# Patient Record
Sex: Female | Born: 1995 | Race: Black or African American | Hispanic: No | Marital: Single | Smoking: Never smoker
Health system: Southern US, Community
[De-identification: ages and names within clinical notes are randomized; demographics above are authoritative.]

## PROBLEM LIST (undated history)

## (undated) DIAGNOSIS — R569 Unspecified convulsions: Secondary | ICD-10-CM

## (undated) HISTORY — PX: TONSILLECTOMY: SUR1361

---

## 2017-02-14 DIAGNOSIS — N939 Abnormal uterine and vaginal bleeding, unspecified: Secondary | ICD-10-CM | POA: Insufficient documentation

## 2017-04-02 ENCOUNTER — Encounter: Attending: Family Medicine | Primary: Family Medicine

## 2017-04-04 ENCOUNTER — Encounter: Primary: Family Medicine

## 2017-04-09 ENCOUNTER — Encounter: Primary: Family Medicine

## 2017-04-11 ENCOUNTER — Encounter: Primary: Family Medicine

## 2017-07-25 ENCOUNTER — Inpatient Hospital Stay
Admit: 2017-07-25 | Discharge: 2017-07-25 | Disposition: A | Discharge status: Home / Self Care | Payer: BLUE CROSS/BLUE SHIELD | Attending: Emergency Medicine

## 2017-07-25 DIAGNOSIS — M2560 Stiffness of unspecified joint, not elsewhere classified: Secondary | ICD-10-CM

## 2017-07-25 MED ORDER — NAPROXEN 500 MG TAB
500 mg | ORAL_TABLET | Freq: Two times a day (BID) | ORAL | 0 refills | Status: AC
Start: 2017-07-25 — End: 2017-08-04

## 2017-07-25 NOTE — ED Notes (Signed)
I have reviewed discharge instructions with the patient.  The patient verbalized understanding.    Patient left ED via Discharge Method: ambulatory to Home with self.   Opportunity for questions and clarification provided.       Patient given 1 scripts.         To continue your aftercare when you leave the hospital, you may receive an automated call from our care team to check in on how you are doing.  This is a free service and part of our promise to provide the best care and service to meet your aftercare needs.??? If you have questions, or wish to unsubscribe from this service please call [phone].  Thank you for Choosing our [facility].
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
## 2018-04-14 DIAGNOSIS — R569 Unspecified convulsions: Secondary | ICD-10-CM | POA: Insufficient documentation

## 2018-05-14 DIAGNOSIS — G43009 Migraine without aura, not intractable, without status migrainosus: Secondary | ICD-10-CM | POA: Insufficient documentation

## 2018-09-15 ENCOUNTER — Emergency Department: Admit: 2018-09-15 | Payer: BLUE CROSS/BLUE SHIELD | Primary: Family Medicine

## 2018-09-15 ENCOUNTER — Inpatient Hospital Stay
Admit: 2018-09-15 | Discharge: 2018-09-16 | Disposition: A | Discharge status: Home / Self Care | Payer: BLUE CROSS/BLUE SHIELD | Attending: Emergency Medicine

## 2018-09-15 DIAGNOSIS — R569 Unspecified convulsions: Secondary | ICD-10-CM

## 2018-09-15 LAB — CBC W/O DIFF
ABSOLUTE NRBC: 0 10*3/uL (ref 0.0–0.2)
HCT: 45.3 % (ref 35.8–46.3)
HGB: 15.8 g/dL — ABNORMAL HIGH (ref 11.7–15.4)
MCH: 31.1 PG (ref 26.1–32.9)
MCHC: 34.9 g/dL (ref 31.4–35.0)
MCV: 89.2 FL (ref 79.6–97.8)
MPV: 10.1 FL (ref 9.4–12.3)
PLATELET: 268 10*3/uL (ref 150–450)
RBC: 5.08 M/uL (ref 4.05–5.2)
RDW: 12.5 % (ref 11.9–14.6)
WBC: 8.6 10*3/uL (ref 4.3–11.1)

## 2018-09-15 LAB — METABOLIC PANEL, COMPREHENSIVE
A-G Ratio: 1.1 — ABNORMAL LOW (ref 1.2–3.5)
ALT (SGPT): 36 U/L (ref 12–65)
AST (SGOT): 22 U/L (ref 15–37)
Albumin: 4.2 g/dL (ref 3.5–5.0)
Alk. phosphatase: 72 U/L (ref 50–130)
Anion gap: 6 mmol/L — ABNORMAL LOW (ref 7–16)
BUN: 9 MG/DL (ref 6–23)
Bilirubin, total: 0.4 MG/DL (ref 0.2–1.1)
CO2: 26 mmol/L (ref 21–32)
Calcium: 9.4 MG/DL (ref 8.3–10.4)
Chloride: 108 mmol/L — ABNORMAL HIGH (ref 98–107)
Creatinine: 0.92 MG/DL (ref 0.6–1.0)
GFR est AA: >60 mL/min/{1.73_m2} (ref >60)
GFR est non-AA: >60 mL/min/{1.73_m2} (ref >60)
Globulin: 4 g/dL — ABNORMAL HIGH (ref 2.3–3.5)
Glucose: 96 mg/dL (ref 65–100)
Potassium: 3.7 mmol/L (ref 3.5–5.1)
Protein, total: 8.2 g/dL (ref 6.3–8.2)
Sodium: 140 mmol/L (ref 136–145)

## 2018-09-15 LAB — HCG URINE, QL. - POC
HCG, Pregnancy, Urine, POC: NEGATIVE
Pregnancy test,urine (POC): NEGATIVE

## 2018-09-15 LAB — COMPREHENSIVE METABOLIC PANEL
ALT: 36 U/L (ref 12–65)
AST: 22 U/L (ref 15–37)
Albumin/Globulin Ratio: 1.1 — ABNORMAL LOW (ref 1.2–3.5)
Albumin: 4.2 g/dL (ref 3.5–5.0)
Alkaline Phosphatase: 72 U/L (ref 50–130)
Anion Gap: 6 mmol/L — ABNORMAL LOW (ref 7–16)
BUN: 9 MG/DL (ref 6–23)
CO2: 26 mmol/L (ref 21–32)
Calcium: 9.4 MG/DL (ref 8.3–10.4)
Chloride: 108 mmol/L — ABNORMAL HIGH (ref 98–107)
Creatinine: 0.92 MG/DL (ref 0.6–1.0)
GFR African American: >60 mL/min/{1.73_m2} (ref >60)
Globulin: 4 g/dL — ABNORMAL HIGH (ref 2.3–3.5)
Glucose: 96 mg/dL (ref 65–100)
Potassium: 3.7 mmol/L (ref 3.5–5.1)
Sodium: 140 mmol/L (ref 136–145)
Total Bilirubin: 0.4 MG/DL (ref 0.2–1.1)
Total Protein: 8.2 g/dL (ref 6.3–8.2)
eGFR NON-AA: >60 mL/min/{1.73_m2} (ref >60)

## 2018-09-15 LAB — CBC
Hematocrit: 45.3 % (ref 35.8–46.3)
Hemoglobin: 15.8 g/dL — ABNORMAL HIGH (ref 11.7–15.4)
MCH: 31.1 PG (ref 26.1–32.9)
MCHC: 34.9 g/dL (ref 31.4–35.0)
MCV: 89.2 FL (ref 79.6–97.8)
MPV: 10.1 FL (ref 9.4–12.3)
NRBC Absolute: 0 10*3/uL (ref 0.0–0.2)
Platelets: 268 10*3/uL (ref 150–450)
RBC: 5.08 M/uL (ref 4.05–5.2)
RDW: 12.5 % (ref 11.9–14.6)
WBC: 8.6 10*3/uL (ref 4.3–11.1)

## 2018-09-15 NOTE — ED Notes (Signed)
I have reviewed discharge instructions with the patient.  The patient verbalized understanding. Patient to follow up with her neuro tomorrow as referred and RTED with any further SZ activity, severe HAs, any other acute complaints. Patient expresses understanding. Patient from ED in NAD, no new Rx.

## 2018-09-15 NOTE — ED Provider Notes (Signed)
23 year old female presents with concerns about having had a seizure as she was walking into her parents house today.  She said it is her third seizure over the last 6 months.  She said her first seizure was approximately 6 months ago.  She does not recall exactly what happened.  This happened at around 4:00 today.  EMS came but she did not want to go where EMS wanted to take her so she came here instead.  She said that she recently switched antiseizure medicines approximately 2 weeks ago with her neurologist.    She said that she did hit her head and now has a mild headache.    Denies any other symptoms including no chest pain, difficulty breathing, nausea, vomiting, diarrhea, or abdominal pain.               No past medical history on file.    Past Surgical History:   Procedure Laterality Date   ??? HX TONSILLECTOMY           No family history on file.    Social History     Socioeconomic History   ??? Marital status: LIFE PARTNER     Spouse name: Not on file   ??? Number of children: Not on file   ??? Years of education: Not on file   ??? Highest education level: Not on file   Occupational History   ??? Not on file   Social Needs   ??? Financial resource strain: Not on file   ??? Food insecurity:     Worry: Not on file     Inability: Not on file   ??? Transportation needs:     Medical: Not on file     Non-medical: Not on file   Tobacco Use   ??? Smoking status: Never Smoker   ??? Smokeless tobacco: Never Used   Substance and Sexual Activity   ??? Alcohol use: No     Frequency: Never   ??? Drug use: No   ??? Sexual activity: Not on file   Lifestyle   ??? Physical activity:     Days per week: Not on file     Minutes per session: Not on file   ??? Stress: Not on file   Relationships   ??? Social connections:     Talks on phone: Not on file     Gets together: Not on file     Attends religious service: Not on file     Active member of club or organization: Not on file     Attends meetings of clubs or organizations: Not on file     Relationship status:  Not on file   ??? Intimate partner violence:     Fear of current or ex partner: Not on file     Emotionally abused: Not on file     Physically abused: Not on file     Forced sexual activity: Not on file   Other Topics Concern   ??? Not on file   Social History Narrative   ??? Not on file         ALLERGIES: Patient has no known allergies.    Review of Systems   Constitutional: Negative for chills, diaphoresis and fever.   HENT: Negative for congestion, rhinorrhea and sore throat.    Eyes: Negative for redness and visual disturbance.   Respiratory: Negative for cough, chest tightness, shortness of breath and wheezing.    Cardiovascular: Negative for chest pain and palpitations.  Gastrointestinal: Negative for abdominal pain, blood in stool, diarrhea, nausea and vomiting.   Endocrine: Negative for polydipsia and polyuria.   Genitourinary: Negative for dysuria and hematuria.   Musculoskeletal: Negative for arthralgias, myalgias and neck stiffness.   Skin: Negative for rash.   Allergic/Immunologic: Negative for environmental allergies and food allergies.   Neurological: Positive for seizures and headaches. Negative for dizziness and weakness.   Hematological: Negative for adenopathy. Does not bruise/bleed easily.   Psychiatric/Behavioral: Negative for confusion and sleep disturbance. The patient is not nervous/anxious.        Vitals:    09/15/18 1715   BP: 138/87   Pulse: 77   Resp: 18   Temp: 98.9 ??F (37.2 ??C)   SpO2: 98%   Weight: 87.5 kg (193 lb)   Height: 5\' 5"  (1.651 m)            Physical Exam  Vitals signs and nursing note reviewed.   Constitutional:       General: She is not in acute distress.     Appearance: She is well-developed. She is not toxic-appearing.   HENT:      Head: Normocephalic and atraumatic.   Eyes:      General: No scleral icterus.        Right eye: No discharge.         Left eye: No discharge.      Conjunctiva/sclera: Conjunctivae normal.      Pupils: Pupils are equal, round, and reactive to light.    Neck:      Musculoskeletal: Normal range of motion. No neck rigidity.   Cardiovascular:      Rate and Rhythm: Normal rate and regular rhythm.      Heart sounds: Normal heart sounds.   Pulmonary:      Effort: Pulmonary effort is normal. No respiratory distress.      Breath sounds: Normal breath sounds. No wheezing or rales.   Chest:      Chest wall: No tenderness.   Abdominal:      General: Bowel sounds are normal. There is no distension.      Palpations: Abdomen is soft.      Tenderness: There is no guarding or rebound.   Musculoskeletal: Normal range of motion.         General: No tenderness.   Lymphadenopathy:      Cervical: No cervical adenopathy.   Skin:     General: Skin is warm and dry.   Neurological:      General: No focal deficit present.      Mental Status: She is alert and oriented to person, place, and time.   Psychiatric:         Mood and Affect: Mood normal.         Behavior: Behavior normal.          MDM  Number of Diagnoses or Management Options  Diagnosis management comments: Given the fall and hitting her head I will get a CT of her head to rule out occult intracranial injury.         Procedures

## 2018-09-15 NOTE — ED Notes (Signed)
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 Pt had a seizure aound 1600 Witnessed by family. EMS came, but pt did not want to go to the hospital with. Them. Pt takes tribecca, takes medication as prescribed. Pt fell hit posterior right side, no bleeding noted but painful to tough. Pt bit her tongue and lip on right side. Pt remembers her brother talking to her but was unable to respond. Pt a/o x 4 at this time

## 2018-09-15 NOTE — ED Triage Notes (Signed)
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 Pt had a seizure aound 1600 Witnessed by family. EMS came, but pt did not want to go to the hospital with. Them. Pt takes tribecca, takes medication as prescribed. Pt fell hit posterior right side, no bleeding noted but painful to tough. Pt bit her tongue and lip on right side. Pt remembers her brother talking to her but was unable to respond. Pt a/o x 4 at this time
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
## 2018-09-15 NOTE — ED Notes (Signed)
I have reviewed discharge instructions with the patient.  The patient verbalized understanding. Patient to follow up with her neuro tomorrow as referred and RTED with any further SZ activity, severe HAs, any other acute complaints. Patient expresses understanding. Patient from ED in NAD, no new Rx.

## 2019-01-28 DIAGNOSIS — N920 Excessive and frequent menstruation with regular cycle: Secondary | ICD-10-CM | POA: Insufficient documentation

## 2019-09-14 ENCOUNTER — Encounter (HOSPITAL_COMMUNITY): Payer: Self-pay | Admitting: Emergency Medicine

## 2019-09-14 ENCOUNTER — Other Ambulatory Visit: Payer: Self-pay

## 2019-09-14 ENCOUNTER — Emergency Department (HOSPITAL_COMMUNITY)
Admission: EM | Admit: 2019-09-14 | Discharge: 2019-09-14 | Disposition: A | Discharge status: Home / Self Care | Payer: BC Managed Care – PPO | Attending: Emergency Medicine | Admitting: Emergency Medicine

## 2019-09-14 ENCOUNTER — Emergency Department (HOSPITAL_COMMUNITY): Payer: BC Managed Care – PPO

## 2019-09-14 DIAGNOSIS — Y92410 Unspecified street and highway as the place of occurrence of the external cause: Secondary | ICD-10-CM | POA: Insufficient documentation

## 2019-09-14 DIAGNOSIS — R569 Unspecified convulsions: Secondary | ICD-10-CM | POA: Diagnosis not present

## 2019-09-14 DIAGNOSIS — Y999 Unspecified external cause status: Secondary | ICD-10-CM | POA: Diagnosis not present

## 2019-09-14 DIAGNOSIS — Y93I9 Activity, other involving external motion: Secondary | ICD-10-CM | POA: Diagnosis not present

## 2019-09-14 DIAGNOSIS — M542 Cervicalgia: Secondary | ICD-10-CM | POA: Insufficient documentation

## 2019-09-14 HISTORY — DX: Unspecified convulsions: R56.9

## 2019-09-14 MED ORDER — ACETAMINOPHEN 500 MG PO TABS
1000.0000 mg | ORAL_TABLET | Freq: Once | ORAL | Status: AC
  Administered 2019-09-14: 1000 mg via ORAL
  Filled 2019-09-14: qty 2

## 2019-09-14 NOTE — ED Notes (Signed)
Pt requesting something for headache,  Dr. Adela Lank made aware

## 2019-09-14 NOTE — ED Notes (Signed)
[employer] in talking with pt at this time.  Pt remains alert and oriented x's 4

## 2019-09-14 NOTE — ED Notes (Signed)
Pt up to bedside commode with assistance without any problems
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
## 2019-09-15 ENCOUNTER — Encounter: Payer: Self-pay | Admitting: Neurology

## 2019-12-16 ENCOUNTER — Encounter: Payer: Self-pay | Admitting: Obstetrics

## 2019-12-16 ENCOUNTER — Other Ambulatory Visit: Payer: Self-pay

## 2019-12-16 ENCOUNTER — Ambulatory Visit (INDEPENDENT_AMBULATORY_CARE_PROVIDER_SITE_OTHER): Payer: BC Managed Care – PPO | Admitting: Obstetrics

## 2019-12-16 VITALS — BP 115/72 | Ht 65.0 in | Wt 199.8 lb

## 2019-12-16 DIAGNOSIS — Z113 Encounter for screening for infections with a predominantly sexual mode of transmission: Secondary | ICD-10-CM | POA: Diagnosis not present

## 2019-12-16 DIAGNOSIS — B373 Candidiasis of vulva and vagina: Secondary | ICD-10-CM

## 2019-12-16 DIAGNOSIS — Z30011 Encounter for initial prescription of contraceptive pills: Secondary | ICD-10-CM

## 2019-12-16 DIAGNOSIS — B3731 Acute candidiasis of vulva and vagina: Secondary | ICD-10-CM

## 2019-12-16 DIAGNOSIS — N926 Irregular menstruation, unspecified: Secondary | ICD-10-CM | POA: Diagnosis not present

## 2019-12-16 DIAGNOSIS — Z01419 Encounter for gynecological examination (general) (routine) without abnormal findings: Secondary | ICD-10-CM | POA: Insufficient documentation

## 2019-12-16 DIAGNOSIS — N939 Abnormal uterine and vaginal bleeding, unspecified: Secondary | ICD-10-CM

## 2019-12-16 LAB — POCT URINE PREGNANCY: Preg Test, Ur: NEGATIVE

## 2019-12-16 MED ORDER — TERCONAZOLE 0.4 % VA CREA: 1.0000 | TOPICAL_CREAM | Freq: Every day | VAGINAL | 1 refills | Status: DC

## 2019-12-16 MED ORDER — LO LOESTRIN FE 1 MG-10 MCG / 10 MCG PO TABS: 1.0000 | ORAL_TABLET | Freq: Every day | ORAL | 11 refills | Status: DC

## 2019-12-16 NOTE — Progress Notes (Signed)
.     Gynecology Annual Exam  PCP: Bethany Powers, No Pcp Per  Chief Complaint:  Chief Complaint  Bethany Powers presents with  . Gynecologic Exam    npa     History of Present Illness: Bethany Powers is a 24 y.o. No obstetric history on file. presents for annual exam. The Bethany Powers has no complaints today.   LMP: Bethany Powers's last menstrual period was 09/13/2019. Menarche:11 Average Interval: irregular, has been beeding irregularly for several months  Duration of flow: ongoing  Heavy Menses: no Clots: no Intermenstrual Bleeding: yes Postcoital Bleeding: not applicable Dysmenorrhea: no  The Bethany Powers is not currently sexually active. She currently uses none for contraception. She denies dyspareunia.  The Bethany Powers does not perform self breast exams.  There is no notable family history of breast or ovarian cancer in her family.  The Bethany Powers wears seatbelts: yes.  The Bethany Powers has regular exercise: no.    The Bethany Powers denies current symptoms of depression.    Review of Systems: ROS  Past Medical History:  Bethany Powers Active Problem List   Diagnosis Date Noted  . Encounter for annual routine gynecological examination 12/16/2019  . Abnormal uterine bleeding (AUB) 12/16/2019    12/16/19-new Annual pt- continuous light vaginal bleeding x 3 months reported   . Routine screening for STI (sexually transmitted infection) 12/16/2019    Past Surgical History:  Past Surgical History:  Procedure Laterality Date  . TONSILLECTOMY      Gynecologic History:  Bethany Powers's last menstrual period was 09/13/2019. Contraception: none and previously used OCPs starting at age 82, but then stopped after 2-3 years. Last Pap: Results were: has never had a pap smear per her report.    Obstetric History: No obstetric history on file.  Family History:  No family history on file.  Social History:  Social History   Socioeconomic History  . Marital status: Single    Spouse name: Not on file  . Number of children: Not on file   . Years of education: Not on file  . Highest education level: Not on file  Occupational History  . Not on file  Tobacco Use  . Smoking status: Never Smoker  . Smokeless tobacco: Never Used  Substance and Sexual Activity  . Alcohol use: Never  . Drug use: Never  . Sexual activity: Not on file  Other Topics Concern  . Not on file  Social History Narrative  . Not on file   Social Determinants of Health   Financial Resource Strain:   . Difficulty of Paying Living Expenses:   Food Insecurity:   . Worried About Programme researcher, broadcasting/film/video in the Last Year:   . Barista in the Last Year:   Transportation Needs:   . Freight forwarder (Medical):   Marland Kitchen Lack of Transportation (Non-Medical):   Physical Activity:   . Days of Exercise per Week:   . Minutes of Exercise per Session:   Stress:   . Feeling of Stress :   Social Connections:   . Frequency of Communication with Friends and Family:   . Frequency of Social Gatherings with Friends and Family:   . Attends Religious Services:   . Active Member of Clubs or Organizations:   . Attends Banker Meetings:   Marland Kitchen Marital Status:   Intimate Partner Violence:   . Fear of Current or Ex-Partner:   . Emotionally Abused:   Marland Kitchen Physically Abused:   . Sexually Abused:     Allergies:  No Known  Allergies  Medications: Prior to Admission medications   Medication Sig Start Date End Date Taking? Authorizing Provider  Norethindrone-Ethinyl Estradiol-Fe Biphas (LO LOESTRIN FE) 1 MG-10 MCG / 10 MCG tablet Take 1 tablet by mouth daily. 12/16/19 03/09/20  Mirna Mires, CNM  terconazole (TERAZOL 7) 0.4 % vaginal cream Place 1 applicator vaginally at bedtime. 12/16/19   Mirna Mires, CNM    Physical Exam Vitals: Blood pressure 115/72, height 5\' 5"  (1.651 m), weight 199 lb 12.8 oz (90.6 kg), last menstrual period 09/13/2019.  General: NAD HEENT: normocephalic, anicteric Thyroid: no enlargement, no palpable nodules Pulmonary:  No increased work of breathing, CTAB Cardiovascular: RRR, distal pulses 2+ Breast: Breast symmetrical, no tenderness, no palpable nodules or masses, no skin or nipple retraction present, no nipple discharge.  No axillary or supraclavicular lymphadenopathy. Abdomen: NABS, soft, non-tender, non-distended.  Umbilicus without lesions.  No hepatomegaly, splenomegaly or masses palpable. No evidence of hernia  Genitourinary:  External: Normal external female genitalia.  Normal urethral meatus, normal Bartholin's and Skene's glands.    Vagina: Normal vaginal mucosa, no evidence of prolapse.    Cervix: Grossly normal in appearance, no bleeding  Uterus: Non-enlarged, mobile, normal contour.  No CMT  Adnexa: ovaries non-enlarged, no adnexal masses  Rectal: deferred  Lymphatic: no evidence of inguinal lymphadenopathy Extremities: no edema, erythema, or tenderness Neurologic: Grossly intact Psychiatric: mood appropriate, affect full  Female chaperone present for pelvic and breast  portions of the physical exam    Assessment: 24 y.o. No obstetric history on file. routine annual exam  Plan: Problem List Items Addressed This Visit      Genitourinary   Abnormal uterine bleeding (AUB)     Other   Encounter for annual routine gynecological examination - Primary   Relevant Medications   terconazole (TERAZOL 7) 0.4 % vaginal cream   Norethindrone-Ethinyl Estradiol-Fe Biphas (LO LOESTRIN FE) 1 MG-10 MCG / 10 MCG tablet   Other Relevant Orders   CBC With Differential   TSH   IGP,CtNgTv,rfx Aptima HPV ASCU   HIV Antibody (routine testing w rflx)   POCT urine pregnancy (Completed)   Routine screening for STI (sexually transmitted infection)   Relevant Orders   IGP,CtNgTv,rfx Aptima HPV ASCU   HIV Antibody (routine testing w rflx)   POCT urine pregnancy (Completed)    Other Visit Diagnoses    Yeast vaginitis       Relevant Medications   terconazole (TERAZOL 7) 0.4 % vaginal cream   Other  Relevant Orders   IGP,CtNgTv,rfx Aptima HPV ASCU   POCT urine pregnancy (Completed)   Irregular menses       Relevant Medications   Norethindrone-Ethinyl Estradiol-Fe Biphas (LO LOESTRIN FE) 1 MG-10 MCG / 10 MCG tablet   Other Relevant Orders   CBC With Differential   TSH   POCT urine pregnancy (Completed)   Encounter for initial prescription of contraceptive pills       Relevant Medications   Norethindrone-Ethinyl Estradiol-Fe Biphas (LO LOESTRIN FE) 1 MG-10 MCG / 10 MCG tablet   Other Relevant Orders   POCT urine pregnancy (Completed)      1) 4) Gardasil Series discussed and if applicable offered to Bethany Powers - Bethany Powers has not previously completed 3 shot series   2) STI screening  was offered and accepted. She consents to HIV screening as well as routine STI testing.  3)  ASCCP guidelines and rational discussed.  Bethany Powers opts for screenig based on the results of this first pap smear screening  interval  4) Contraception - the Bethany Powers is currently using  none.  She is interested in starting Contraception: and would like to use OCPs We discussed safe sex practices to reduce her furture risk of STI's.  Sample pack of Lo Loestrin FE provided with careful instructions. The benefits/risk associated with OCPs reviewed.    5) Return in about 1 year (around 12/15/2020), or if symptoms worsen or fail to improve, for annual gyn physical.   6) Labs drawn to explore possible causes of her irregular bleeding. We discussed the use of OCPs to regulate her menses. A prescription fo 11 refills provided.  7) Wet mount performed secondary to heavy , clumpy white vaginal discharge. Yeast vaginitis- Terazol prescribed. Encouraged to return in one year for an annual gyn physical or PRN for any other GYN concerns.  Imagene Riches, CNM  12/16/2019 11:56 AM  [facility] 12/16/2019, 11:36 AM

## 2019-12-16 NOTE — Patient Instructions (Addendum)
Health Maintenance, Female Adopting a healthy lifestyle and getting preventive care are important in promoting health and wellness. Ask your health care provider about:  The right schedule for you to have regular tests and exams.  Things you can do on your own to prevent diseases and keep yourself healthy. What should I know about diet, weight, and exercise? Eat a healthy diet   Eat a diet that includes plenty of vegetables, fruits, low-fat dairy products, and lean protein.  Do not eat a lot of foods that are high in solid fats, added sugars, or sodium. Maintain a healthy weight Body mass index (BMI) is used to identify weight problems. It estimates body fat based on height and weight. Your health care provider can help determine your BMI and help you achieve or maintain a healthy weight. Get regular exercise Get regular exercise. This is one of the most important things you can do for your health. Most adults should:  Exercise for at least 150 minutes each week. The exercise should increase your heart rate and make you sweat (moderate-intensity exercise).  Do strengthening exercises at least twice a week. This is in addition to the moderate-intensity exercise.  Spend less time sitting. Even light physical activity can be beneficial. Watch cholesterol and blood lipids Have your blood tested for lipids and cholesterol at 26 years of age, then have this test every 5 years. Have your cholesterol levels checked more often if:  Your lipid or cholesterol levels are high.  You are older than 26 years of age.  You are at high risk for heart disease. What should I know about cancer screening? Depending on your health history and family history, you may need to have cancer screening at various ages. This may include screening for:  Breast cancer.  Cervical cancer.  Colorectal cancer.  Skin cancer.  Lung cancer. What should I know about heart disease, diabetes, and high blood  pressure? Blood pressure and heart disease  High blood pressure causes heart disease and increases the risk of stroke. This is more likely to develop in people who have high blood pressure readings, are of African descent, or are overweight.  Have your blood pressure checked: ? Every 3-5 years if you are 18-39 years of age. ? Every year if you are 40 years old or older. Diabetes Have regular diabetes screenings. This checks your fasting blood sugar level. Have the screening done:  Once every three years after age 40 if you are at a normal weight and have a low risk for diabetes.  More often and at a younger age if you are overweight or have a high risk for diabetes. What should I know about preventing infection? Hepatitis B If you have a higher risk for hepatitis B, you should be screened for this virus. Talk with your health care provider to find out if you are at risk for hepatitis B infection. Hepatitis C Testing is recommended for:  Everyone born from 1945 through 1965.  Anyone with known risk factors for hepatitis C. Sexually transmitted infections (STIs)  Get screened for STIs, including gonorrhea and chlamydia, if: ? You are sexually active and are younger than 26 years of age. ? You are older than 26 years of age and your health care provider tells you that you are at risk for this type of infection. ? Your sexual activity has changed since you were last screened, and you are at increased risk for chlamydia or gonorrhea. Ask your health care provider if   you are at risk.  Ask your health care provider about whether you are at high risk for HIV. Your health care provider may recommend a prescription medicine to help prevent HIV infection. If you choose to take medicine to prevent HIV, you should first get tested for HIV. You should then be tested every 3 months for as long as you are taking the medicine. Pregnancy  If you are about to stop having your period (premenopausal) and  you may become pregnant, seek counseling before you get pregnant.  Take 400 to 800 micrograms (mcg) of folic acid every day if you become pregnant.  Ask for birth control (contraception) if you want to prevent pregnancy. Osteoporosis and menopause Osteoporosis is a disease in which the bones lose minerals and strength with aging. This can result in bone fractures. If you are 7 years old or older, or if you are at risk for osteoporosis and fractures, ask your health care provider if you should:  Be screened for bone loss.  Take a calcium or vitamin D supplement to lower your risk of fractures.  Be given hormone replacement therapy (HRT) to treat symptoms of menopause. Follow these instructions at home: Lifestyle  Do not use any products that contain nicotine or tobacco, such as cigarettes, e-cigarettes, and chewing tobacco. If you need help quitting, ask your health care provider.  Do not use street drugs.  Do not share needles.  Ask your health care provider for help if you need support or information about quitting drugs. Alcohol use  Do not drink alcohol if: ? Your health care provider tells you not to drink. ? You are pregnant, may be pregnant, or are planning to become pregnant.  If you drink alcohol: ? Limit how much you use to 0-1 drink a day. ? Limit intake if you are breastfeeding.  Be aware of how much alcohol is in your drink. In the [place] one drink equals one 12 oz bottle of beer (355 mL), one 5 oz glass of wine (148 mL), or one 1 oz glass of hard liquor (44 mL). General instructions  Schedule regular health, dental, and eye exams.  Stay current with your vaccines.  Tell your health care provider if: ? You often feel depressed. ? You have ever been abused or do not feel safe at home. Summary  Adopting a healthy lifestyle and getting preventive care are important in promoting health and wellness.  Follow your health care provider's instructions about healthy  diet, exercising, and getting tested or screened for diseases.  Follow your health care provider's instructions on monitoring your cholesterol and blood pressure. This information is not intended to replace advice given to you by your health care provider. Make sure you discuss any questions you have with your health care provider. Document Revised: 06/24/2018 Document Reviewed: 06/24/2018 Elsevier Patient Education  2020 [employer].   Contraception Choices Contraception, also called birth control, means things to use or ways to try not to get pregnant. Hormonal birth control This kind of birth control uses hormones. Here are some types of hormonal birth control:  A tube that is put under skin of the arm (implant). The tube can stay in for as long as 3 years.  Shots to get every 3 months (injections).  Pills to take every day (birth control pills).  A patch to change 1 time each week for 3 weeks (birth control patch). After that, the patch is taken off for 1 week.  A ring to put  in the vagina. The ring is left in for 3 weeks. Then it is taken out of the vagina for 1 week. Then a new ring is put in.  Pills to take after unprotected sex (emergency birth control pills). Barrier birth control Here are some types of barrier birth control:  A thin covering that is put on the penis before sex (female condom). The covering is thrown away after sex.  A soft, loose covering that is put in the vagina before sex (female condom). The covering is thrown away after sex.  A rubber bowl that sits over the cervix (diaphragm). The bowl must be made for you. The bowl is put into the vagina before sex. The bowl is left in for 6-8 hours after sex. It is taken out within 24 hours.  A small, soft cup that fits over the cervix (cervical cap). The cup must be made for you. The cup can be left in for 6-8 hours after sex. It is taken out within 48 hours.  A sponge that is put into the vagina before sex. It  must be left in for at least 6 hours after sex. It must be taken out within 30 hours. Then it is thrown away.  A chemical that kills or stops sperm from getting into the uterus (spermicide). It may be a pill, cream, jelly, or foam to put in the vagina. The chemical should be used at least 10-15 minutes before sex. IUD (intrauterine) birth control An IUD is a small, T-shaped piece of plastic. It is put inside the uterus. There are two kinds:  Hormone IUD. This kind can stay in for 3-5 years.  Copper IUD. This kind can stay in for 10 years. Permanent birth control Here are some types of permanent birth control:  Surgery to block the fallopian tubes.  Having an insert put into each fallopian tube.  Surgery to tie off the tubes that carry sperm (vasectomy). Natural planning birth control Here are some types of natural planning birth control:  Not having sex on the days the woman could get pregnant.  Using a calendar: ? To keep track of the length of each period. ? To find out what days pregnancy can happen. ? To plan to not have sex on days when pregnancy can happen.  Watching for symptoms of ovulation and not having sex during ovulation. One way the woman can check for ovulation is to check her temperature.  Waiting to have sex until after ovulation. Summary  Contraception, also called birth control, means things to use or ways to try not to get pregnant.  Hormonal methods of birth control include implants, injections, pills, patches, vaginal rings, and emergency birth control pills.  Barrier methods of birth control can include female condoms, female condoms, diaphragms, cervical caps, sponges, and spermicides.  There are two types of IUD (intrauterine device) birth control. An IUD can be put in a woman's uterus to prevent pregnancy for 3-5 years.  Permanent sterilization can be done through a procedure for males, females, or both.  Natural planning methods involve not having  sex on the days when the woman could get pregnant. This information is not intended to replace advice given to you by your health care provider. Make sure you discuss any questions you have with your health care provider. Document Revised: 10/21/2018 Document Reviewed: 07/11/2016 Elsevier Patient Education  2020 Elsevier Inc.  Vaginal Yeast Infection, Adult  Vaginal yeast infection is a condition that causes vaginal discharge as well  as soreness, swelling, and redness (inflammation) of the vagina. This is a common condition. Some women get this infection frequently. What are the causes? This condition is caused by a change in the normal balance of the yeast (candida) and bacteria that live in the vagina. This change causes an overgrowth of yeast, which causes the inflammation. What increases the risk? The condition is more likely to develop in women who:  Take antibiotic medicines.  Have diabetes.  Take birth control pills.  Are pregnant.  Douche often.  Have a weak body defense system (immune system).  Have been taking steroid medicines for a long time.  Frequently wear tight clothing. What are the signs or symptoms? Symptoms of this condition include:  White, thick, creamy vaginal discharge.  Swelling, itching, redness, and irritation of the vagina. The lips of the vagina (vulva) may be affected as well.  Pain or a burning feeling while urinating.  Pain during sex. How is this diagnosed? This condition is diagnosed based on:  Your medical history.  A physical exam.  A pelvic exam. Your health care provider will examine a sample of your vaginal discharge under a microscope. Your health care provider may send this sample for testing to confirm the diagnosis. How is this treated? This condition is treated with medicine. Medicines may be over-the-counter or prescription. You may be told to use one or more of the following:  Medicine that is taken by mouth  (orally).  Medicine that is applied as a cream (topically).  Medicine that is inserted directly into the vagina (suppository). Follow these instructions at home:  Lifestyle  Do not have sex until your health care provider approves. Tell your sex partner that you have a yeast infection. That person should go to his or her health care provider and ask if they should also be treated.  Do not wear tight clothes, such as pantyhose or tight pants.  Wear breathable cotton underwear. General instructions  Take or apply over-the-counter and prescription medicines only as told by your health care provider.  Eat more yogurt. This may help to keep your yeast infection from returning.  Do not use tampons until your health care provider approves.  Try taking a sitz bath to help with discomfort. This is a warm water bath that is taken while you are sitting down. The water should only come up to your hips and should cover your buttocks. Do this 3-4 times per day or as told by your health care provider.  Do not douche.  If you have diabetes, keep your blood sugar levels under control.  Keep all follow-up visits as told by your health care provider. This is important. Contact a health care provider if:  You have a fever.  Your symptoms go away and then return.  Your symptoms do not get better with treatment.  Your symptoms get worse.  You have new symptoms.  You develop blisters in or around your vagina.  You have blood coming from your vagina and it is not your menstrual period.  You develop pain in your abdomen. Summary  Vaginal yeast infection is a condition that causes discharge as well as soreness, swelling, and redness (inflammation) of the vagina.  This condition is treated with medicine. Medicines may be over-the-counter or prescription.  Take or apply over-the-counter and prescription medicines only as told by your health care provider.  Do not douche. Do not have sex or  use tampons until your health care provider approves.  Contact a  health care provider if your symptoms do not get better with treatment or your symptoms go away and then return. This information is not intended to replace advice given to you by your health care provider. Make sure you discuss any questions you have with your health care provider. Document Revised: 01/29/2019 Document Reviewed: 11/17/2017 Elsevier Patient Education  2020 [employer].

## 2019-12-17 ENCOUNTER — Encounter: Payer: Self-pay | Admitting: Obstetrics

## 2019-12-17 LAB — TSH: TSH: 1.09 u[IU]/mL (ref 0.450–4.500)

## 2019-12-17 LAB — CBC WITH DIFFERENTIAL
Basophils Absolute: 0 10*3/uL (ref 0.0–0.2)
Basos: 0 %
EOS (ABSOLUTE): 0 10*3/uL (ref 0.0–0.4)
Eos: 1 %
Hematocrit: 42.5 % (ref 34.0–46.6)
Hemoglobin: 14.7 g/dL (ref 11.1–15.9)
Immature Grans (Abs): 0 10*3/uL (ref 0.0–0.1)
Immature Granulocytes: 0 %
Lymphocytes Absolute: 2.3 10*3/uL (ref 0.7–3.1)
Lymphs: 52 %
MCH: 32 pg (ref 26.6–33.0)
MCHC: 34.6 g/dL (ref 31.5–35.7)
MCV: 92 fL (ref 79–97)
Monocytes Absolute: 0.4 10*3/uL (ref 0.1–0.9)
Monocytes: 8 %
Neutrophils Absolute: 1.7 10*3/uL (ref 1.4–7.0)
Neutrophils: 39 %
RBC: 4.6 x10E6/uL (ref 3.77–5.28)
RDW: 12.9 % (ref 11.7–15.4)
WBC: 4.5 10*3/uL (ref 3.4–10.8)

## 2019-12-17 LAB — HIV ANTIBODY (ROUTINE TESTING W REFLEX): HIV Screen 4th Generation wRfx: NONREACTIVE

## 2019-12-18 LAB — IGP,CTNGTV,RFX APTIMA HPV ASCU
Chlamydia, Nuc. Acid Amp: NEGATIVE
Gonococcus, Nuc. Acid Amp: NEGATIVE
Trich vag by NAA: NEGATIVE

## 2019-12-20 ENCOUNTER — Other Ambulatory Visit: Payer: Self-pay

## 2019-12-20 ENCOUNTER — Ambulatory Visit (INDEPENDENT_AMBULATORY_CARE_PROVIDER_SITE_OTHER): Payer: BC Managed Care – PPO | Admitting: Neurology

## 2019-12-20 ENCOUNTER — Encounter: Payer: Self-pay | Admitting: Neurology

## 2019-12-20 VITALS — BP 112/78 | HR 62 | Ht 65.0 in | Wt 201.8 lb

## 2019-12-20 DIAGNOSIS — G43009 Migraine without aura, not intractable, without status migrainosus: Secondary | ICD-10-CM | POA: Diagnosis not present

## 2019-12-20 DIAGNOSIS — G40009 Localization-related (focal) (partial) idiopathic epilepsy and epileptic syndromes with seizures of localized onset, not intractable, without status epilepticus: Secondary | ICD-10-CM

## 2019-12-20 MED ORDER — ZONISAMIDE 100 MG PO CAPS: ORAL_CAPSULE | ORAL | 6 refills | Status: DC
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
## 2019-12-20 NOTE — Patient Instructions (Addendum)
Good to meet you!  1. Start Zonisamide 100mg : Take 1 capsule every night for 2 weeks, then increase to 2 capsules every night for 2 weeks, then increase to 3 capsules every night and continue  2. Schedule 1-hour EEG  3. Recommend eye evaluation, referral will be sent  4. As per [place] driving laws, no driving until 6 months, seizure-free  5. Follow-up in 3-4 months, call for any changes  Seizure Precautions: 1. If medication has been prescribed for you to prevent seizures, take it exactly as directed.  Do not stop taking the medicine without talking to your doctor first, even if you have not had a seizure in a long time.   2. Avoid activities in which a seizure would cause danger to yourself or to others.  Don't operate dangerous machinery, swim alone, or climb in high or dangerous places, such as on ladders, roofs, or girders.  Do not drive unless your doctor says you may.  3. If you have any warning that you may have a seizure, lay down in a safe place where you can't hurt yourself.    4.  No driving for 6 months from last seizure, as per [facility].   Please refer to the following link on the [employer]'s website for more information: http://www.epilepsyfoundation.org/answerplace/Social/driving/drivingu.cfm   5.  Maintain good sleep hygiene. Avoid alcohol.  6.  Notify your neurology if you are planning pregnancy or if you become pregnant.  7.  Contact your doctor if you have any problems that may be related to the medicine you are taking.  8.  Call 911 and bring the patient back to the ED if:        A.  The seizure lasts longer than 5 minutes.       B.  The patient doesn't awaken shortly after the seizure  C.  The patient has new problems such as difficulty seeing, speaking or moving  D.  The patient was injured during the seizure  E.  The patient has a temperature over 102 F (39C)  F.  The patient vomited and now is having trouble breathing

## 2019-12-22 ENCOUNTER — Other Ambulatory Visit: Payer: Self-pay

## 2019-12-22 ENCOUNTER — Ambulatory Visit (INDEPENDENT_AMBULATORY_CARE_PROVIDER_SITE_OTHER): Payer: BC Managed Care – PPO | Admitting: Neurology

## 2019-12-22 DIAGNOSIS — G40009 Localization-related (focal) (partial) idiopathic epilepsy and epileptic syndromes with seizures of localized onset, not intractable, without status epilepticus: Secondary | ICD-10-CM

## 2019-12-27 NOTE — Procedures (Signed)
ELECTROENCEPHALOGRAM REPORT  Date of Study: 12/22/2019  Patient's Name: Bethany Powers MRN: [id number] Date of Birth: 30-Nov-1995  Referring Provider: Dr. Patrcia Dolly  Clinical History: This is a 24 year old woman with recurrent seizures where she feels her body "shutting down" followed by loss of consciousness. EEG for classification.  Medications: Zonisamide  Technical Summary: A multichannel digital 1-hour EEG recording measured by the international 10-20 system with electrodes applied with paste and impedances below 5000 ohms performed in our laboratory with EKG monitoring in an awake and asleep patient.  Hyperventilation was not performed. Photic stimulation was performed.  The digital EEG was referentially recorded, reformatted, and digitally filtered in a variety of bipolar and referential montages for optimal display.    Description: The patient is awake and asleep during the recording.  During maximal wakefulness, there is a symmetric, medium voltage 10 Hz posterior dominant rhythm that attenuates with eye opening.  The record is symmetric.  During drowsiness and sleep, there is an increase in theta slowing of the background.  Vertex waves and symmetric sleep spindles were seen.  Photic stimulation did not elicit any abnormalities.  There were frequent bursts of generalized irregular high voltage 4-5 Hz spike and wave discharges with frontal predominance lasting 1-4 seconds without clinical correlate, seen predominantly in sleep. No electrographic seizures seen.    EKG lead was unremarkable.  Impression: This 1-hour awake and asleep EEG is abnormal due to frequent bursts of generalized irregular 4-5 Hz spike and wave discharges lasting 1-4 seconds.   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 Clinical correlation is advised.   Patrcia Dolly, M.D.

## 2020-04-11 ENCOUNTER — Ambulatory Visit: Payer: BC Managed Care – PPO | Admitting: Neurology
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
## 2021-12-12 ENCOUNTER — Encounter (HOSPITAL_BASED_OUTPATIENT_CLINIC_OR_DEPARTMENT_OTHER): Payer: Self-pay

## 2021-12-12 ENCOUNTER — Emergency Department (HOSPITAL_BASED_OUTPATIENT_CLINIC_OR_DEPARTMENT_OTHER)
Admission: EM | Admit: 2021-12-12 | Discharge: 2021-12-12 | Disposition: A | Discharge status: Home / Self Care | Payer: BC Managed Care – PPO | Attending: Emergency Medicine | Admitting: Emergency Medicine

## 2021-12-12 ENCOUNTER — Other Ambulatory Visit: Payer: Self-pay

## 2021-12-12 ENCOUNTER — Other Ambulatory Visit (HOSPITAL_BASED_OUTPATIENT_CLINIC_OR_DEPARTMENT_OTHER): Payer: Self-pay

## 2021-12-12 DIAGNOSIS — N939 Abnormal uterine and vaginal bleeding, unspecified: Secondary | ICD-10-CM

## 2021-12-12 DIAGNOSIS — Z30011 Encounter for initial prescription of contraceptive pills: Secondary | ICD-10-CM | POA: Insufficient documentation

## 2021-12-12 DIAGNOSIS — N926 Irregular menstruation, unspecified: Secondary | ICD-10-CM | POA: Diagnosis not present

## 2021-12-12 DIAGNOSIS — Z01419 Encounter for gynecological examination (general) (routine) without abnormal findings: Secondary | ICD-10-CM

## 2021-12-12 LAB — BASIC METABOLIC PANEL
Anion gap: 9 (ref 5–15)
BUN: 11 mg/dL (ref 6–20)
CO2: 24 mmol/L (ref 22–32)
Calcium: 9.1 mg/dL (ref 8.9–10.3)
Chloride: 107 mmol/L (ref 98–111)
Creatinine, Ser: 0.96 mg/dL (ref 0.44–1.00)
GFR, Estimated: >60 mL/min (ref >60)
Glucose, Bld: 93 mg/dL (ref 70–99)
Potassium: 4.2 mmol/L (ref 3.5–5.1)
Sodium: 140 mmol/L (ref 135–145)

## 2021-12-12 LAB — CBC WITH DIFFERENTIAL/PLATELET
Abs Immature Granulocytes: 0.01 10*3/uL (ref 0.00–0.07)
Basophils Absolute: 0 10*3/uL (ref 0.0–0.1)
Basophils Relative: 0 %
Eosinophils Absolute: 0 10*3/uL (ref 0.0–0.5)
Eosinophils Relative: 1 %
HCT: 40.5 % (ref 36.0–46.0)
Hemoglobin: 14 g/dL (ref 12.0–15.0)
Immature Granulocytes: 0 %
Lymphocytes Relative: 50 %
Lymphs Abs: 2.4 10*3/uL (ref 0.7–4.0)
MCH: 31.1 pg (ref 26.0–34.0)
MCHC: 34.6 g/dL (ref 30.0–36.0)
MCV: 90 fL (ref 80.0–100.0)
Monocytes Absolute: 0.4 10*3/uL (ref 0.1–1.0)
Monocytes Relative: 9 %
Neutro Abs: 1.9 10*3/uL (ref 1.7–7.7)
Neutrophils Relative %: 40 %
Platelets: 249 10*3/uL (ref 150–400)
RBC: 4.5 MIL/uL (ref 3.87–5.11)
RDW: 12.6 % (ref 11.5–15.5)
WBC: 4.8 10*3/uL (ref 4.0–10.5)
nRBC: 0 % (ref 0.0–0.2)

## 2021-12-12 LAB — URINALYSIS, ROUTINE W REFLEX MICROSCOPIC
Bilirubin Urine: NEGATIVE
Glucose, UA: NEGATIVE mg/dL
Ketones, ur: NEGATIVE mg/dL
Leukocytes,Ua: NEGATIVE
Nitrite: NEGATIVE
Protein, ur: NEGATIVE mg/dL
RBC / HPF: >50 RBC/hpf — ABNORMAL HIGH (ref 0–5)
Specific Gravity, Urine: 1.022 (ref 1.005–1.030)
pH: 5.5 (ref 5.0–8.0)

## 2021-12-12 LAB — PREGNANCY, URINE: Preg Test, Ur: NEGATIVE

## 2021-12-12 MED ORDER — BLISOVI 24 FE 1-20 MG-MCG(24) PO TABS
1.0000 | ORAL_TABLET | Freq: Every day | ORAL | 0 refills | Status: DC
  Filled 2021-12-12: qty 28, 28d supply, fill #0

## 2021-12-12 NOTE — Discharge Instructions (Addendum)
Follow-up with your OB/GYN doctor in 1 week.   Return immediately back to the ER if:  Your symptoms worsen within the next 12-24 hours. You develop new symptoms such as new fevers, persistent vomiting, new pain, shortness of breath, or new weakness or numbness, or if you have any other concerns.

## 2021-12-12 NOTE — ED Triage Notes (Signed)
Onset 8 weeks of vaginal bleed.  Appointment with OB in one week  but bleeding has become worse last couple of days including passing large clots.  Having lower abdominal pain.  States has become dizzy this am in shower.

## 2021-12-12 NOTE — ED Provider Notes (Addendum)
[facility] EMERGENCY DEPT Provider Note   CSN: [id number] Arrival date & time: 12/12/21  0759     History  Chief Complaint  Patient presents with   Vaginal Bleeding    Bethany Powers is a 26 y.o. female.  Patient presents chief complaint of vaginal bleeding.  She states she had a period every day for the past 8 weeks, however has become more heavy in the last week.  She is changes about 5 pads a day today in the shower she had an episode of lightheadedness decided come into the ER.  She was an appointment with her OB/GYN doctor in 1 week.  Otherwise denies fevers or cough no vomiting or diarrhea no headache no chest pain no abdominal pain.      Home Medications Prior to Admission medications   Medication Sig Start Date End Date Taking? Authorizing Provider  Norethindrone Acetate-Ethinyl Estrad-FE (BLISOVI 24 FE) 1-20 MG-MCG(24) tablet Take 1 tablet by mouth daily. 12/12/21   Luna Fuse, MD  terconazole (TERAZOL 7) 0.4 % vaginal cream Place 1 applicator vaginally at bedtime. 12/16/19   Imagene Riches, CNM  zonisamide (ZONEGRAN) 100 MG capsule Take 1 capsule every night for 2 weeks, then increase to 2 capsules every night for 2 weeks, then increase to 3 capsules every night and continue 12/20/19   Cameron Sprang, MD      Allergies    Patient has no known allergies.    Review of Systems   Review of Systems  Constitutional:  Negative for fever.  HENT:  Negative for ear pain.   Eyes:  Negative for pain.  Respiratory:  Negative for cough.   Cardiovascular:  Negative for chest pain.  Gastrointestinal:  Negative for abdominal pain.  Genitourinary:  Negative for flank pain.  Musculoskeletal:  Negative for back pain.  Skin:  Negative for rash.  Neurological:  Negative for headaches.   Physical Exam Updated Vital Signs BP 111/75   Pulse 82   Temp 98.6 F (37 C) (Oral)   Resp 18   Ht 5\' 5"  (1.651 m)   Wt 83 kg   SpO2 100%   BMI 30.45 kg/m  Physical  Exam Constitutional:      General: She is not in acute distress.    Appearance: Normal appearance.  HENT:     Head: Normocephalic.     Nose: Nose normal.  Eyes:     Extraocular Movements: Extraocular movements intact.  Cardiovascular:     Rate and Rhythm: Normal rate.  Pulmonary:     Effort: Pulmonary effort is normal.  Genitourinary:    Comments: No brisk vaginal bleeding noted. Musculoskeletal:        General: Normal range of motion.     Cervical back: Normal range of motion.  Neurological:     General: No focal deficit present.     Mental Status: She is alert. Mental status is at baseline.    ED Results / Procedures / Treatments   Labs (all labs ordered are listed, but only abnormal results are displayed) Labs Reviewed  URINALYSIS, ROUTINE W REFLEX MICROSCOPIC - Abnormal; Notable for the following components:      Result Value   Hgb urine dipstick LARGE (*)    RBC / HPF >50 (*)    Bacteria, UA FEW (*)    All other components within normal limits  CBC WITH DIFFERENTIAL/PLATELET  BASIC METABOLIC PANEL  PREGNANCY, URINE    EKG None  Radiology No results found.  Procedures Procedures    Medications Ordered in ED Medications - No data to display  ED Course/ Medical Decision Making/ A&P                           Medical Decision Making Amount and/or Complexity of Data Reviewed Labs: ordered.  Risk Prescription drug management.   Chart review shows office visit May 05, 2020 for annual physical exam.  Cardiac monitoring showing sinus rhythm.  Labs otherwise unremarkable white count normal hemoglobin 14.  Vital signs are stable.  Patient has vaginal bleeding for 2 months now but hemodynamically stable.  We have patient restart her birth control medication, she has an OB/GYN appointment within the week which advised her to keep.  Advised return if she has worsening symptoms increased bleeding or any additional concerns.         Final Clinical  Impression(s) / ED Diagnoses Final diagnoses:  Irregular menses  Encounter for initial prescription of contraceptive pills  Encounter for annual routine gynecological examination    Rx / DC Orders ED Discharge Orders          Ordered    Norethindrone Acetate-Ethinyl Estrad-FE (BLISOVI 24 FE) 1-20 MG-MCG(24) tablet  Daily        12/12/21 0934              Luna Fuse, MD 12/12/21 [id number]    Luna Fuse, MD 12/12/21 [phone]

## 2023-01-17 ENCOUNTER — Encounter (HOSPITAL_COMMUNITY): Payer: Self-pay

## 2023-01-17 ENCOUNTER — Emergency Department (HOSPITAL_COMMUNITY)
Admission: EM | Admit: 2023-01-17 | Discharge: 2023-01-17 | Disposition: A | Discharge status: Home / Self Care | Payer: BC Managed Care – PPO | Attending: Emergency Medicine | Admitting: Emergency Medicine

## 2023-01-17 ENCOUNTER — Other Ambulatory Visit: Payer: Self-pay

## 2023-01-17 DIAGNOSIS — E876 Hypokalemia: Secondary | ICD-10-CM

## 2023-01-17 DIAGNOSIS — S00502A Unspecified superficial injury of oral cavity, initial encounter: Secondary | ICD-10-CM | POA: Diagnosis present

## 2023-01-17 DIAGNOSIS — S00512A Abrasion of oral cavity, initial encounter: Secondary | ICD-10-CM | POA: Diagnosis not present

## 2023-01-17 DIAGNOSIS — W01198A Fall on same level from slipping, tripping and stumbling with subsequent striking against other object, initial encounter: Secondary | ICD-10-CM | POA: Diagnosis not present

## 2023-01-17 DIAGNOSIS — R569 Unspecified convulsions: Secondary | ICD-10-CM

## 2023-01-17 DIAGNOSIS — Z87891 Personal history of nicotine dependence: Secondary | ICD-10-CM | POA: Insufficient documentation

## 2023-01-17 DIAGNOSIS — G40909 Epilepsy, unspecified, not intractable, without status epilepticus: Secondary | ICD-10-CM | POA: Diagnosis not present

## 2023-01-17 DIAGNOSIS — E162 Hypoglycemia, unspecified: Secondary | ICD-10-CM | POA: Diagnosis not present

## 2023-01-17 DIAGNOSIS — S00211A Abrasion of right eyelid and periocular area, initial encounter: Secondary | ICD-10-CM | POA: Insufficient documentation

## 2023-01-17 LAB — BASIC METABOLIC PANEL
Anion gap: 12 (ref 5–15)
BUN: 11 mg/dL (ref 6–20)
CO2: 24 mmol/L (ref 22–32)
Calcium: 8.9 mg/dL (ref 8.9–10.3)
Chloride: 102 mmol/L (ref 98–111)
Creatinine, Ser: 1.08 mg/dL — ABNORMAL HIGH (ref 0.44–1.00)
GFR, Estimated: >60 mL/min (ref >60)
Glucose, Bld: 80 mg/dL (ref 70–99)
Potassium: 2.9 mmol/L — ABNORMAL LOW (ref 3.5–5.1)
Sodium: 138 mmol/L (ref 135–145)

## 2023-01-17 LAB — CBG MONITORING, ED
Glucose-Capillary: 61 mg/dL — ABNORMAL LOW (ref 70–99)
Glucose-Capillary: 73 mg/dL (ref 70–99)

## 2023-01-17 LAB — CBC WITH DIFFERENTIAL/PLATELET
Abs Immature Granulocytes: 0.01 10*3/uL (ref 0.00–0.07)
Basophils Absolute: 0 10*3/uL (ref 0.0–0.1)
Basophils Relative: 0 %
Eosinophils Absolute: 0 10*3/uL (ref 0.0–0.5)
Eosinophils Relative: 1 %
HCT: 41.9 % (ref 36.0–46.0)
Hemoglobin: 14.4 g/dL (ref 12.0–15.0)
Immature Granulocytes: 0 %
Lymphocytes Relative: 44 %
Lymphs Abs: 2.1 10*3/uL (ref 0.7–4.0)
MCH: 30.4 pg (ref 26.0–34.0)
MCHC: 34.4 g/dL (ref 30.0–36.0)
MCV: 88.6 fL (ref 80.0–100.0)
Monocytes Absolute: 0.4 10*3/uL (ref 0.1–1.0)
Monocytes Relative: 8 %
Neutro Abs: 2.3 10*3/uL (ref 1.7–7.7)
Neutrophils Relative %: 47 %
Platelets: 258 10*3/uL (ref 150–400)
RBC: 4.73 MIL/uL (ref 3.87–5.11)
RDW: 13.2 % (ref 11.5–15.5)
WBC: 4.9 10*3/uL (ref 4.0–10.5)
nRBC: 0 % (ref 0.0–0.2)

## 2023-01-17 LAB — HCG, SERUM, QUALITATIVE: Preg, Serum: NEGATIVE

## 2023-01-17 MED ORDER — POTASSIUM CHLORIDE CRYS ER 20 MEQ PO TBCR
40.0000 meq | EXTENDED_RELEASE_TABLET | Freq: Once | ORAL | Status: AC
  Administered 2023-01-17: 40 meq via ORAL
  Filled 2023-01-17: qty 2

## 2023-01-17 MED ORDER — ONDANSETRON 4 MG PO TBDP
4.0000 mg | ORAL_TABLET | Freq: Once | ORAL | Status: AC
  Administered 2023-01-17: 4 mg via ORAL
  Filled 2023-01-17: qty 1

## 2023-01-17 MED ORDER — POTASSIUM CHLORIDE ER 10 MEQ PO TBCR: 10.0000 meq | EXTENDED_RELEASE_TABLET | Freq: Every day | ORAL | 0 refills | Status: DC

## 2023-01-17 MED ORDER — ACETAMINOPHEN 325 MG PO TABS
650.0000 mg | ORAL_TABLET | Freq: Once | ORAL | Status: AC
  Administered 2023-01-17: 650 mg via ORAL
  Filled 2023-01-17: qty 2

## 2023-01-17 NOTE — ED Notes (Signed)
Pt given juice and crackers with peanut butter for hypoglycemia

## 2023-01-17 NOTE — Discharge Instructions (Signed)
You were seen in the emergency department after your seizure.  You had no signs of significant injury from your seizure.  Your workup did show that you are blood sugar was slightly low as well as your potassium level which can lower your threshold to have seizures.  You should make sure that you are eating frequent regular meals and staying well-hydrated.  We have also given you a potassium supplement to take for the next week.  You can follow-up with your primary doctor to have your blood sugar and potassium levels rechecked.  We have also given you a new referral to reestablish with neurology for further management of your seizures and to determine if you need to be restarted on a seizure medication.  You should return to the emergency department if you have back-to-back seizures without returning to your self in between, you have a prolonged seizure lasting more than 5 to 10 minutes, you injure yourself during a seizure or if you have any other new or concerning symptoms.

## 2023-01-17 NOTE — ED Triage Notes (Signed)
Pt reports having an aura while on the phone with friend and then had a seizure. Friend called EMS. EMS reports patient was in bed and postictal when they got her. Pt fell onto plant stand hitting left side of face. Hx of seizures. No prescribed medications for seizures. (-) seizures. Back to baseline at this time. Last seizure prior to today was 5 months ago.

## 2023-01-17 NOTE — ED Provider Notes (Signed)
[place] EMERGENCY DEPARTMENT AT [facility] Provider Note   CSN: [id number] Arrival date & time: 01/17/23  2116     History  No chief complaint on file.   Bethany Powers is a 27 y.o. female.  Patient is a 27 year old female with a past medical history of seizure disorder presenting to the emergency department for a seizure.  The patient states that she was on the phone on a work call when next thing she knew she was waking up to the ambulance there.  She states that she did bite her tongue and has a mild headache and thinks that she may have hit her head on a plant on her nightstand.  She reports that she has otherwise been feeling well recently without any nausea, vomiting or diarrhea.  She states that she is not currently on any seizure medications because the last medication "she did not like how it made her feel" and she states that she has not seen neurology since.  She states that her last seizure was about 5 months ago.  She denies any drug or alcohol use and states that she stopped smoking about a week ago.  The history is provided by the patient.       Home Medications Prior to Admission medications   Medication Sig Start Date End Date Taking? Authorizing Provider  potassium chloride (KLOR-CON) 10 MEQ tablet Take 1 tablet (10 mEq total) by mouth daily. 01/17/23  Yes Theresia Lo, [place] K, DO  Norethindrone Acetate-Ethinyl Estrad-FE (BLISOVI 24 FE) 1-20 MG-MCG(24) tablet Take 1 tablet by mouth daily. 12/12/21   Cheryll Cockayne, MD  terconazole (TERAZOL 7) 0.4 % vaginal cream Place 1 applicator vaginally at bedtime. 12/16/19   Mirna Mires, CNM  zonisamide (ZONEGRAN) 100 MG capsule Take 1 capsule every night for 2 weeks, then increase to 2 capsules every night for 2 weeks, then increase to 3 capsules every night and continue 12/20/19   Van Clines, MD      Allergies    Patient has no known allergies.    Review of Systems   Review of Systems  Physical  Exam Updated Vital Signs BP (!) 137/96   Pulse 88   Temp 99 F (37.2 C) (Oral)   Resp 16   Ht 5' 5.5" (1.664 m)   Wt 93 kg   SpO2 94%   BMI 33.59 kg/m  Physical Exam Vitals and nursing note reviewed.  Constitutional:      General: She is not in acute distress.    Appearance: Normal appearance.  HENT:     Head: Normocephalic and atraumatic.     Nose: Nose normal.     Mouth/Throat:     Mouth: Mucous membranes are moist.     Comments: Small abrasion to R side of tongue Eyes:     Extraocular Movements: Extraocular movements intact.     Conjunctiva/sclera: Conjunctivae normal.     Pupils: Pupils are equal, round, and reactive to light.  Cardiovascular:     Rate and Rhythm: Normal rate and regular rhythm.     Heart sounds: Normal heart sounds.  Pulmonary:     Effort: Pulmonary effort is normal.     Breath sounds: Normal breath sounds.  Abdominal:     General: Abdomen is flat.     Palpations: Abdomen is soft.     Tenderness: There is no abdominal tenderness.  Musculoskeletal:        General: Normal range of motion.  Cervical back: Normal range of motion and neck supple.  Skin:    General: Skin is warm and dry.     Comments: Small, non-bleeding abrasion to R eyebrow  Neurological:     General: No focal deficit present.     Mental Status: She is alert and oriented to person, place, and time.     Cranial Nerves: No cranial nerve deficit.     Sensory: No sensory deficit.     Motor: No weakness.     Coordination: Coordination normal.  Psychiatric:        Mood and Affect: Mood normal.        Behavior: Behavior normal.     ED Results / Procedures / Treatments   Labs (all labs ordered are listed, but only abnormal results are displayed) Labs Reviewed  BASIC METABOLIC PANEL - Abnormal; Notable for the following components:      Result Value   Potassium 2.9 (*)    Creatinine, Ser 1.08 (*)    All other components within normal limits  CBG MONITORING, ED - Abnormal;  Notable for the following components:   Glucose-Capillary 61 (*)    All other components within normal limits  HCG, SERUM, QUALITATIVE  CBC WITH DIFFERENTIAL/PLATELET  CBG MONITORING, ED    EKG None  Radiology No results found.  Procedures Procedures    Medications Ordered in ED Medications  ondansetron (ZOFRAN-ODT) disintegrating tablet 4 mg (4 mg Oral Given 01/17/23 2202)  acetaminophen (TYLENOL) tablet 650 mg (650 mg Oral Given 01/17/23 2202)  potassium chloride SA (KLOR-CON M) CR tablet 40 mEq (40 mEq Oral Given 01/17/23 2301)    ED Course/ Medical Decision Making/ A&P Clinical Course as of 01/17/23 2324  Fri Jan 17, 2023  2216 Glucose 61, will be given juice and have sugar reassessed. [VK]  2252 BMP with hypokalemia, she will be repleted. [VK]    Clinical Course User Index [VK] [name], DO                             Medical Decision Making This patient presents to the ED with chief complaint(s) of seizure with pertinent past medical history of seizure disorder, migraines which further complicates the presenting complaint. The complaint involves an extensive differential diagnosis and also carries with it a high risk of complications and morbidity.    The differential diagnosis includes seizure, hypo/hyperglycemia, electrolyte abnormality, pregnancy   Additional history obtained: Additional history obtained from EMS  Records reviewed outpatient neurology records  ED Course and Reassessment: On patient's arrival to the emergency department she is hemodynamically stable in no acute distress and appears to be back to her neurologic baseline.  She does have evidence of a tongue bite and a small abrasion to her forehead but without significant neurologic deficits that are significant trauma making ICH or mass effect unlikely.  Patient will have Accu-Chek and pregnancy as well as basic labs and will be monitored for any further seizure-like activity.  Independent  labs interpretation:  The following labs were independently interpreted: mild hypoglycemia and hypokalemia  Independent visualization of imaging: -N/A  Consultation: N/A  Consideration for admission or further workup: Patient has no emergent conditions requiring admission or further work-up at this time and is stable for discharge home with primary care follow-up  Social Determinants of health: N/A    Amount and/or Complexity of Data Reviewed Labs: ordered.  Risk OTC drugs. Prescription drug management.  Final Clinical Impression(s) / ED Diagnoses Final diagnoses:  Seizure (HCC)  Hypoglycemia  Hypokalemia    Rx / DC Orders ED Discharge Orders          Ordered    potassium chloride (KLOR-CON) 10 MEQ tablet  Daily        01/17/23 2322    Ambulatory referral to Neurology       Comments: An appointment is requested in approximately: 4 weeks   01/17/23 2322              [name], DO 01/17/23 2324

## 2024-02-13 ENCOUNTER — Encounter (HOSPITAL_COMMUNITY): Payer: Self-pay | Admitting: Emergency Medicine

## 2024-02-13 ENCOUNTER — Other Ambulatory Visit: Payer: Self-pay

## 2024-02-13 ENCOUNTER — Emergency Department (HOSPITAL_COMMUNITY)
Admission: EM | Admit: 2024-02-13 | Discharge: 2024-02-13 | Disposition: A | Discharge status: Home / Self Care | Payer: Self-pay

## 2024-02-13 DIAGNOSIS — X58XXXA Exposure to other specified factors, initial encounter: Secondary | ICD-10-CM | POA: Diagnosis not present

## 2024-02-13 DIAGNOSIS — R569 Unspecified convulsions: Secondary | ICD-10-CM | POA: Diagnosis not present

## 2024-02-13 DIAGNOSIS — S00512A Abrasion of oral cavity, initial encounter: Secondary | ICD-10-CM | POA: Insufficient documentation

## 2024-02-13 DIAGNOSIS — S0993XA Unspecified injury of face, initial encounter: Secondary | ICD-10-CM | POA: Diagnosis present

## 2024-02-13 LAB — COMPREHENSIVE METABOLIC PANEL WITH GFR
ALT: 21 U/L (ref 0–44)
AST: 24 U/L (ref 15–41)
Albumin: 4.2 g/dL (ref 3.5–5.0)
Alkaline Phosphatase: 59 U/L (ref 38–126)
Anion gap: 13 (ref 5–15)
BUN: 10 mg/dL (ref 6–20)
CO2: 20 mmol/L — ABNORMAL LOW (ref 22–32)
Calcium: 9.1 mg/dL (ref 8.9–10.3)
Chloride: 105 mmol/L (ref 98–111)
Creatinine, Ser: 0.91 mg/dL (ref 0.44–1.00)
GFR, Estimated: >60 mL/min (ref >60)
Glucose, Bld: 102 mg/dL — ABNORMAL HIGH (ref 70–99)
Potassium: 3.7 mmol/L (ref 3.5–5.1)
Sodium: 138 mmol/L (ref 135–145)
Total Bilirubin: 0.3 mg/dL (ref 0.0–1.2)
Total Protein: 8 g/dL (ref 6.5–8.1)

## 2024-02-13 LAB — CBC WITH DIFFERENTIAL/PLATELET
Abs Immature Granulocytes: 0.01 K/uL (ref 0.00–0.07)
Basophils Absolute: 0 K/uL (ref 0.0–0.1)
Basophils Relative: 0 %
Eosinophils Absolute: 0 K/uL (ref 0.0–0.5)
Eosinophils Relative: 1 %
HCT: 42.5 % (ref 36.0–46.0)
Hemoglobin: 13.4 g/dL (ref 12.0–15.0)
Immature Granulocytes: 0 %
Lymphocytes Relative: 46 %
Lymphs Abs: 2.3 K/uL (ref 0.7–4.0)
MCH: 27.4 pg (ref 26.0–34.0)
MCHC: 31.5 g/dL (ref 30.0–36.0)
MCV: 86.9 fL (ref 80.0–100.0)
Monocytes Absolute: 0.4 K/uL (ref 0.1–1.0)
Monocytes Relative: 8 %
Neutro Abs: 2.2 K/uL (ref 1.7–7.7)
Neutrophils Relative %: 45 %
Platelets: 288 K/uL (ref 150–400)
RBC: 4.89 MIL/uL (ref 3.87–5.11)
RDW: 14.6 % (ref 11.5–15.5)
WBC: 5 K/uL (ref 4.0–10.5)
nRBC: 0 % (ref 0.0–0.2)

## 2024-02-13 LAB — RAPID URINE DRUG SCREEN, HOSP PERFORMED
Amphetamines: NOT DETECTED
Barbiturates: NOT DETECTED
Benzodiazepines: NOT DETECTED
Cocaine: NOT DETECTED
Opiates: NOT DETECTED
Tetrahydrocannabinol: POSITIVE — AB

## 2024-02-13 LAB — HCG, SERUM, QUALITATIVE: Preg, Serum: NEGATIVE

## 2024-02-13 LAB — ETHANOL: Alcohol, Ethyl (B): <15 mg/dL (ref <15)

## 2024-02-13 LAB — CBG MONITORING, ED: Glucose-Capillary: 79 mg/dL (ref 70–99)
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
## 2024-02-13 NOTE — Discharge Instructions (Signed)
 You were seen in the ER today for concerns of a seizure. Your labs were thankfully normal and I believe you did likely experience a seizure and would benefit from starting treatment. I placed a referral to neurology for you to be seen by their office. Please reach out to them as well to schedule a visit. Return to the ER for any new or worsening symptoms.

## 2024-02-26 ENCOUNTER — Encounter: Payer: Self-pay | Admitting: Neurology

## 2024-04-23 ENCOUNTER — Ambulatory Visit: Admitting: Neurology

## 2024-05-02 ENCOUNTER — Ambulatory Visit: Admitting: Neurology

## 2024-05-11 ENCOUNTER — Ambulatory Visit (INDEPENDENT_AMBULATORY_CARE_PROVIDER_SITE_OTHER): Admitting: Neurology

## 2024-05-11 ENCOUNTER — Encounter: Payer: Self-pay | Admitting: Neurology

## 2024-05-11 VITALS — BP 116/78 | HR 69 | Ht 65.0 in | Wt 193.2 lb

## 2024-05-11 DIAGNOSIS — H539 Unspecified visual disturbance: Secondary | ICD-10-CM

## 2024-05-11 DIAGNOSIS — G40309 Generalized idiopathic epilepsy and epileptic syndromes, not intractable, without status epilepticus: Secondary | ICD-10-CM

## 2024-05-11 MED ORDER — LEVETIRACETAM ER 500 MG PO TB24: ORAL_TABLET | ORAL | 11 refills | Status: AC
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
## 2024-05-21 ENCOUNTER — Ambulatory Visit: Admitting: Neurology

## 2024-05-24 ENCOUNTER — Encounter: Payer: Self-pay | Admitting: Neurology

## 2024-05-26 ENCOUNTER — Encounter: Payer: Self-pay | Admitting: Neurology

## 2024-05-31 ENCOUNTER — Telehealth: Payer: Self-pay

## 2024-05-31 NOTE — Telephone Encounter (Signed)
 PT MRI was approved Auth number [id number] Valid from 05/26/24 until 07/10/2024

## 2024-06-13 ENCOUNTER — Other Ambulatory Visit

## 2024-07-21 ENCOUNTER — Encounter: Payer: Self-pay | Admitting: Neurology

## 2024-07-21 ENCOUNTER — Ambulatory Visit: Admitting: Neurology
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
